# Patient Record
Sex: Male | Born: 1960 | Race: White | Hispanic: No | Marital: Married | Smoking: Former smoker
Health system: Southern US, Community
[De-identification: ages and names within clinical notes are randomized; demographics above are authoritative.]

## PROBLEM LIST (undated history)

## (undated) DIAGNOSIS — I10 Essential (primary) hypertension: Secondary | ICD-10-CM

---

## 1999-12-18 ENCOUNTER — Emergency Department (HOSPITAL_COMMUNITY): Admission: EM | Admit: 1999-12-18 | Discharge: 1999-12-19 | Payer: Self-pay | Admitting: Emergency Medicine

## 2013-04-06 ENCOUNTER — Emergency Department (HOSPITAL_COMMUNITY)
Admission: EM | Admit: 2013-04-06 | Discharge: 2013-04-06 | Disposition: A | Discharge status: Home / Self Care | Payer: BC Managed Care – PPO | Attending: Emergency Medicine | Admitting: Emergency Medicine

## 2013-04-06 ENCOUNTER — Emergency Department (HOSPITAL_COMMUNITY): Payer: BC Managed Care – PPO

## 2013-04-06 ENCOUNTER — Encounter (HOSPITAL_COMMUNITY): Payer: Self-pay | Admitting: Emergency Medicine

## 2013-04-06 DIAGNOSIS — R42 Dizziness and giddiness: Secondary | ICD-10-CM

## 2013-04-06 DIAGNOSIS — Z79899 Other long term (current) drug therapy: Secondary | ICD-10-CM | POA: Insufficient documentation

## 2013-04-06 DIAGNOSIS — J019 Acute sinusitis, unspecified: Secondary | ICD-10-CM | POA: Insufficient documentation

## 2013-04-06 DIAGNOSIS — J329 Chronic sinusitis, unspecified: Secondary | ICD-10-CM

## 2013-04-06 DIAGNOSIS — R5381 Other malaise: Secondary | ICD-10-CM | POA: Insufficient documentation

## 2013-04-06 DIAGNOSIS — Z87891 Personal history of nicotine dependence: Secondary | ICD-10-CM | POA: Insufficient documentation

## 2013-04-06 DIAGNOSIS — R209 Unspecified disturbances of skin sensation: Secondary | ICD-10-CM | POA: Insufficient documentation

## 2013-04-06 DIAGNOSIS — I10 Essential (primary) hypertension: Secondary | ICD-10-CM | POA: Insufficient documentation

## 2013-04-06 HISTORY — DX: Essential (primary) hypertension: I10

## 2013-04-06 LAB — URINALYSIS, ROUTINE W REFLEX MICROSCOPIC
Glucose, UA: NEGATIVE mg/dL
Leukocytes, UA: NEGATIVE
Specific Gravity, Urine: 1.01 (ref 1.005–1.030)
pH: 7.5 (ref 5.0–8.0)

## 2013-04-06 LAB — CBC WITH DIFFERENTIAL/PLATELET
Basophils Relative: 0 % (ref 0–1)
HCT: 44.8 % (ref 39.0–52.0)
Hemoglobin: 16.2 g/dL (ref 13.0–17.0)
MCHC: 36.2 g/dL — ABNORMAL HIGH (ref 30.0–36.0)
Monocytes Absolute: 0.5 10*3/uL (ref 0.1–1.0)
Monocytes Relative: 8 % (ref 3–12)
Neutro Abs: 4.3 10*3/uL (ref 1.7–7.7)

## 2013-04-06 LAB — BASIC METABOLIC PANEL
BUN: 12 mg/dL (ref 6–23)
CO2: 26 mEq/L (ref 19–32)
Chloride: 104 mEq/L (ref 96–112)
Creatinine, Ser: 0.98 mg/dL (ref 0.50–1.35)
GFR calc Af Amer: >90 mL/min (ref >90)

## 2013-04-06 MED ORDER — FLUTICASONE PROPIONATE 50 MCG/ACT NA SUSP: 2.0000 | Freq: Every day | NASAL | Status: DC

## 2013-04-06 MED ORDER — AZITHROMYCIN 250 MG PO TABS: ORAL_TABLET | ORAL | Status: DC

## 2013-04-06 MED ORDER — SODIUM CHLORIDE 0.9 % IV SOLN
INTRAVENOUS | Status: DC
  Administered 2013-04-06: 14:00:00 via INTRAVENOUS

## 2013-04-06 NOTE — ED Notes (Signed)
MD at bedside. 

## 2013-04-06 NOTE — ED Notes (Signed)
Pt reports recent changes to blood pressure medicine.  Pt reports s/s of weakness, dizziness, and tingling to bilateral hands/fingers.  Increased symptoms in left hand.

## 2013-04-06 NOTE — ED Provider Notes (Signed)
History     CSN: [id number]  Arrival date & time 04/06/13  1259   First MD Initiated Contact with Patient 04/06/13 1303      Chief Complaint  Patient presents with  . Dizziness  . Weakness  . Tingling    bilateral fingers- increased on left hand    (Consider location/radiation/quality/duration/timing/severity/associated sxs/prior treatment) HPI Comments: Patient presents to the ER for evaluation of weakness with dizziness. Patient reports he has not been feeling well for about a week. Patient has had no energy and has been sleeping more than usual. He has had sinus congestion and "cold symptoms". He has been taking DayQuil. This hasn't helped much. He has not had any cough. Patient denies chest pain, heart palpitations and shortness of breath. He has noticed intermittent episodes of tingling in his fingertips, both sides. Symptoms occur, he feels very weak, dizzy and like he is going to pass out. Has not had any syncope. He says when this occurs he feels very anxious his respiratory rate is increased.  Patient is a 52 y.o. male presenting with weakness.  Weakness Pertinent negatives include no chest pain.    Past Medical History  Diagnosis Date  . Hypertension     History reviewed. No pertinent past surgical history.  History reviewed. No pertinent family history.  History  Substance Use Topics  . Smoking status: Former Games developer  . Smokeless tobacco: Not on file  . Alcohol Use: No      Review of Systems  Cardiovascular: Negative for chest pain and palpitations.  Neurological: Positive for dizziness, weakness and numbness.  Psychiatric/Behavioral: The patient is nervous/anxious.   All other systems reviewed and are negative.    Allergies  Review of patient's allergies indicates no known allergies.  Home Medications   Current Outpatient Rx  Name  Route  Sig  Dispense  Refill  . atenolol (TENORMIN) 50 MG tablet   Oral   Take 100 mg by mouth daily.             BP 134/78  Pulse 84  Temp(Src) 98.3 F (36.8 C) (Oral)  Resp 18  SpO2 96%  Physical Exam  Constitutional: He is oriented to person, place, and time. He appears well-developed and well-nourished. No distress.  HENT:  Head: Normocephalic and atraumatic.  Right Ear: Hearing normal.  Nose: Nose normal.  Mouth/Throat: Oropharynx is clear and moist and mucous membranes are normal.  Eyes: Conjunctivae and EOM are normal. Pupils are equal, round, and reactive to light.  Neck: Normal range of motion. Neck supple.  Cardiovascular: Normal rate, regular rhythm, S1 normal and S2 normal.  Exam reveals no gallop and no friction rub.   No murmur heard. Pulmonary/Chest: Effort normal and breath sounds normal. No respiratory distress. He exhibits no tenderness.  Abdominal: Soft. Normal appearance and bowel sounds are normal. There is no hepatosplenomegaly. There is no tenderness. There is no rebound, no guarding, no tenderness at McBurney's point and negative Murphy's sign. No hernia.  Musculoskeletal: Normal range of motion.  Neurological: He is alert and oriented to person, place, and time. He has normal strength. No cranial nerve deficit or sensory deficit. Coordination normal. GCS eye subscore is 4. GCS verbal subscore is 5. GCS motor subscore is 6.  Skin: Skin is warm, dry and intact. No rash noted. No cyanosis.  Psychiatric: He has a normal mood and affect. His speech is normal and behavior is normal. Thought content normal.    ED Course  Procedures (including critical  care time)  EKG:  Date: 04/06/2013  Rate: 78  Rhythm: normal sinus rhythm  QRS Axis: normal  Intervals: normal  ST/T Wave abnormalities: nonspecific T wave changes  Conduction Disutrbances:none  Narrative Interpretation:   Old EKG Reviewed: none available    Labs Reviewed  CBC WITH DIFFERENTIAL - Abnormal; Notable for the following:    MCHC 36.2 (*)    All other components within normal limits  BASIC METABOLIC  PANEL - Abnormal; Notable for the following:    Glucose, Bld 121 (*)    All other components within normal limits  PRO B NATRIURETIC PEPTIDE  TROPONIN I  URINALYSIS, ROUTINE W REFLEX MICROSCOPIC  D-DIMER, QUANTITATIVE   Ct Head Wo Contrast  04/06/2013  *RADIOLOGY REPORT*  Clinical Data: Weakness and dizziness.  Tingling in the hands and fingers.  CT HEAD WITHOUT CONTRAST  Technique:  Contiguous axial images were obtained from the base of the skull through the vertex without contrast.  Comparison: None.  Findings: The brain appears normal without infarct, hemorrhage, mass lesion, mass effect, midline shift or abnormal extra-axial fluid collection.  No hydrocephalus or pneumocephalus.  Mild mucosal thickening maxillary sinus is noted.  IMPRESSION: No acute finding.  Mild maxillary sinus disease.   Original Report Authenticated By: Holley Dexter, M.D.      Diagnosis: 1. Weakness 2. Dizziness 3. Sinusitis    MDM  Patient comes to the ER for evaluation of generalized weakness with intermittent episodes of dizziness, tingling of the fingers. He admits that when the fingers tingle, he is having some anxiety and breathing fast. This is likely hyperventilation syndrome. Symptoms of dizziness may be related to recent blood pressure medication changes. He is now taking atenolol twice a day. This was a recent change in the last week. I recommended was back to the 50 mg once a day until followup with his primary Dr.  Barbaraann Faster neurologic exam is entirely unremarkable. His vital signs have been stable here. He does report "cold symptoms". This is mainly in the sinus congestion and he does have evidence of sinusitis on CAT scan. This might be causing some of his symptoms. He has been using sinus medication, likely containing a stimulant. We'll discontinue this. His Flonase and Zithromax. Followup with primary Dr. in the office.        Gilda Crease, MD 04/06/13 [phone]

## 2014-03-04 ENCOUNTER — Emergency Department (HOSPITAL_COMMUNITY)
Admission: EM | Admit: 2014-03-04 | Discharge: 2014-03-04 | Disposition: A | Discharge status: Home / Self Care | Payer: BC Managed Care – PPO | Attending: Emergency Medicine | Admitting: Emergency Medicine

## 2014-03-04 ENCOUNTER — Emergency Department (HOSPITAL_COMMUNITY): Payer: BC Managed Care – PPO

## 2014-03-04 ENCOUNTER — Encounter (HOSPITAL_COMMUNITY): Payer: Self-pay | Admitting: Emergency Medicine

## 2014-03-04 DIAGNOSIS — R079 Chest pain, unspecified: Secondary | ICD-10-CM | POA: Insufficient documentation

## 2014-03-04 DIAGNOSIS — F430 Acute stress reaction: Secondary | ICD-10-CM | POA: Insufficient documentation

## 2014-03-04 DIAGNOSIS — IMO0002 Reserved for concepts with insufficient information to code with codable children: Secondary | ICD-10-CM | POA: Insufficient documentation

## 2014-03-04 DIAGNOSIS — F411 Generalized anxiety disorder: Secondary | ICD-10-CM | POA: Insufficient documentation

## 2014-03-04 DIAGNOSIS — Z7982 Long term (current) use of aspirin: Secondary | ICD-10-CM | POA: Insufficient documentation

## 2014-03-04 DIAGNOSIS — Z79899 Other long term (current) drug therapy: Secondary | ICD-10-CM | POA: Insufficient documentation

## 2014-03-04 DIAGNOSIS — Z87891 Personal history of nicotine dependence: Secondary | ICD-10-CM | POA: Insufficient documentation

## 2014-03-04 DIAGNOSIS — I10 Essential (primary) hypertension: Secondary | ICD-10-CM | POA: Insufficient documentation

## 2014-03-04 DIAGNOSIS — Z792 Long term (current) use of antibiotics: Secondary | ICD-10-CM | POA: Insufficient documentation

## 2014-03-04 LAB — I-STAT TROPONIN, ED: TROPONIN I, POC: 0 ng/mL (ref 0.00–0.08)

## 2014-03-04 LAB — BASIC METABOLIC PANEL
BUN: 12 mg/dL (ref 6–23)
CALCIUM: 9.3 mg/dL (ref 8.4–10.5)
CO2: 26 meq/L (ref 19–32)
CREATININE: 1.01 mg/dL (ref 0.50–1.35)
Chloride: 102 mEq/L (ref 96–112)
GFR, EST NON AFRICAN AMERICAN: 82 mL/min — AB (ref >90)
Glucose, Bld: 161 mg/dL — ABNORMAL HIGH (ref 70–99)
Potassium: 4.2 mEq/L (ref 3.7–5.3)
SODIUM: 143 meq/L (ref 137–147)

## 2014-03-04 LAB — CBC
HCT: 46.7 % (ref 39.0–52.0)
Hemoglobin: 16.2 g/dL (ref 13.0–17.0)
MCH: 29.8 pg (ref 26.0–34.0)
MCHC: 34.7 g/dL (ref 30.0–36.0)
MCV: 86 fL (ref 78.0–100.0)
PLATELETS: 195 10*3/uL (ref 150–400)
RBC: 5.43 MIL/uL (ref 4.22–5.81)
RDW: 12.3 % (ref 11.5–15.5)
WBC: 4.2 10*3/uL (ref 4.0–10.5)

## 2014-03-04 MED ORDER — LORAZEPAM 1 MG PO TABS: 1.0000 mg | ORAL_TABLET | Freq: Three times a day (TID) | ORAL | Status: AC | PRN

## 2014-03-04 NOTE — Discharge Instructions (Signed)
Chest Pain (Nonspecific) °It is often hard to give a specific diagnosis for the cause of chest pain. There is always a chance that your pain could be related to something serious, such as a heart attack or a blood clot in the lungs. You need to follow up with your caregiver for further evaluation. °CAUSES  °· Heartburn. °· Pneumonia or bronchitis. °· Anxiety or stress. °· Inflammation around your heart (pericarditis) or lung (pleuritis or pleurisy). °· A blood clot in the lung. °· A collapsed lung (pneumothorax). It can develop suddenly on its own (spontaneous pneumothorax) or from injury (trauma) to the chest. °· Shingles infection (herpes zoster virus). °The chest wall is composed of bones, muscles, and cartilage. Any of these can be the source of the pain. °· The bones can be bruised by injury. °· The muscles or cartilage can be strained by coughing or overwork. °· The cartilage can be affected by inflammation and become sore (costochondritis). °DIAGNOSIS  °Lab tests or other studies, such as X-rays, electrocardiography, stress testing, or cardiac imaging, may be needed to find the cause of your pain.  °TREATMENT  °· Treatment depends on what may be causing your chest pain. Treatment may include: °· Acid blockers for heartburn. °· Anti-inflammatory medicine. °· Pain medicine for inflammatory conditions. °· Antibiotics if an infection is present. °· You may be advised to change lifestyle habits. This includes stopping smoking and avoiding alcohol, caffeine, and chocolate. °· You may be advised to keep your head raised (elevated) when sleeping. This reduces the chance of acid going backward from your stomach into your esophagus. °· Most of the time, nonspecific chest pain will improve within 2 to 3 days with rest and mild pain medicine. °HOME CARE INSTRUCTIONS  °· If antibiotics were prescribed, take your antibiotics as directed. Finish them even if you start to feel better. °· For the next few days, avoid physical  activities that bring on chest pain. Continue physical activities as directed. °· Do not smoke. °· Avoid drinking alcohol. °· Only take over-the-counter or prescription medicine for pain, discomfort, or fever as directed by your caregiver. °· Follow your caregiver's suggestions for further testing if your chest pain does not go away. °· Keep any follow-up appointments you made. If you do not go to an appointment, you could develop lasting (chronic) problems with pain. If there is any problem keeping an appointment, you must call to reschedule. °SEEK MEDICAL CARE IF:  °· You think you are having problems from the medicine you are taking. Read your medicine instructions carefully. °· Your chest pain does not go away, even after treatment. °· You develop a rash with blisters on your chest. °SEEK IMMEDIATE MEDICAL CARE IF:  °· You have increased chest pain or pain that spreads to your arm, neck, jaw, back, or abdomen. °· You develop shortness of breath, an increasing cough, or you are coughing up blood. °· You have severe back or abdominal pain, feel nauseous, or vomit. °· You develop severe weakness, fainting, or chills. °· You have a fever. °THIS IS AN EMERGENCY. Do not wait to see if the pain will go away. Get medical help at once. Call your local emergency services (911 in [place] Do not drive yourself to the hospital. °MAKE SURE YOU:  °· Understand these instructions. °· Will watch your condition. °· Will get help right away if you are not doing well or get worse. °Document Released: 09/20/2005 Document Revised: 03/04/2012 Document Reviewed: 07/16/2008 °Exit[employer]® Patient Information ©2014 [employer]   [employer]. ° °

## 2014-03-04 NOTE — ED Notes (Signed)
Social worker at bedside.

## 2014-03-04 NOTE — ED Provider Notes (Signed)
Medical screening examination/treatment/procedure(s) were performed by non-physician practitioner and as supervising physician I was immediately available for consultation/collaboration.  Flint MelterElliott L Islay Polanco, MD 03/04/14 [phone]

## 2014-03-04 NOTE — ED Provider Notes (Signed)
CSN: [id number]     Arrival date & time 03/04/14  1045 History   First MD Initiated Contact with Patient 03/04/14 1120     Chief Complaint  Patient presents with  . Chest Pain     (Consider location/radiation/quality/duration/timing/severity/associated sxs/prior Treatment) HPI Comments: Patient presents emergency department with chief complaints of anxiety and chest pain. He states that the chest pain been intermittent for the past couple of days. He denies any associated shortness of breath, or diaphoresis. Denies any radiating symptoms. He states that he is not having any pain now. He states that he feels anxious. He states that he has a new job, and it comes with a lot of stress. He denies any SI or HI. He states that he is been taking Xanax with some relief. As prescribed by his PCP. He states that he's been feeling weak tired and fatigued for the past couple of months. He reports decreased appetite, and a desire to sleep all the time. No other health problems.  The history is provided by the patient. No language interpreter was used.    Past Medical History  Diagnosis Date  . Hypertension    History reviewed. No pertinent past surgical history. History reviewed. No pertinent family history. History  Substance Use Topics  . Smoking status: Former Games developermoker  . Smokeless tobacco: Not on file  . Alcohol Use: No    Review of Systems  All other systems reviewed and are negative.      Allergies  Codeine  Home Medications   Current Outpatient Rx  Name  Route  Sig  Dispense  Refill  . aspirin EC 81 MG tablet   Oral   Take 81 mg by mouth daily.         Marland Kitchen. atenolol (TENORMIN) 50 MG tablet   Oral   Take 50 mg by mouth 2 (two) times daily.          Marland Kitchen. azithromycin (ZITHROMAX Z-PAK) 250 MG tablet      2 po day one, then 1 daily x 4 days   6 tablet   0   . fish oil-omega-3 fatty acids 1000 MG capsule   Oral   Take 2 g by mouth daily.         . fluticasone (FLONASE)  50 MCG/ACT nasal spray   Nasal   Place 2 sprays into the nose daily.   16 g   0    BP 134/93  Pulse 84  Temp(Src) 98.3 F (36.8 C) (Oral)  Resp 20  SpO2 98% Physical Exam  Nursing note and vitals reviewed. Constitutional: He is oriented to person, place, and time. He appears well-developed and well-nourished.  HENT:  Head: Normocephalic and atraumatic.  Eyes: Conjunctivae and EOM are normal. Pupils are equal, round, and reactive to light. Right eye exhibits no discharge. Left eye exhibits no discharge. No scleral icterus.  Neck: Normal range of motion. Neck supple. No JVD present.  Cardiovascular: Normal rate, regular rhythm and normal heart sounds.  Exam reveals no gallop and no friction rub.   No murmur heard. Pulmonary/Chest: Effort normal and breath sounds normal. No respiratory distress. He has no wheezes. He has no rales. He exhibits no tenderness.  Abdominal: Soft. He exhibits no distension and no mass. There is no tenderness. There is no rebound and no guarding.  Musculoskeletal: Normal range of motion. He exhibits no edema and no tenderness.  Neurological: He is alert and oriented to person, place, and time.  Skin:  Skin is warm and dry.  Psychiatric: He has a normal mood and affect. His behavior is normal. Judgment and thought content normal.    ED Course  Procedures (including critical care time) Results for orders placed during the hospital encounter of 03/04/14  CBC      Result Value Ref Range   WBC 4.2  4.0 - 10.5 K/uL   RBC 5.43  4.22 - 5.81 MIL/uL   Hemoglobin 16.2  13.0 - 17.0 g/dL   HCT 69.6  29.5 - 28.4 %   MCV 86.0  78.0 - 100.0 fL   MCH 29.8  26.0 - 34.0 pg   MCHC 34.7  30.0 - 36.0 g/dL   RDW 13.2  44.0 - 10.2 %   Platelets 195  150 - 400 K/uL  BASIC METABOLIC PANEL      Result Value Ref Range   Sodium 143  137 - 147 mEq/L   Potassium 4.2  3.7 - 5.3 mEq/L   Chloride 102  96 - 112 mEq/L   CO2 26  19 - 32 mEq/L   Glucose, Bld 161 (*) 70 - 99 mg/dL    BUN 12  6 - 23 mg/dL   Creatinine, Ser 7.25  0.50 - 1.35 mg/dL   Calcium 9.3  8.4 - 36.6 mg/dL   GFR calc non Af Amer 82 (*) >90 mL/min   GFR calc Af Amer >90  >90 mL/min  I-STAT TROPOININ, ED      Result Value Ref Range   Troponin i, poc 0.00  0.00 - 0.08 ng/mL   Comment 3            Dg Chest 2 View  03/04/2014   CLINICAL DATA Chest pain  EXAM CHEST  2 VIEW  COMPARISON None.  FINDINGS Lungs are clear. Heart size and pulmonary vascularity are normal. No adenopathy. No pneumothorax. No bone lesions.  IMPRESSION No abnormality noted.  SIGNATURE  Electronically Signed   By: Bretta Bang M.D.   On: 03/04/2014 11:30    Imaging Review Dg Chest 2 View  03/04/2014   CLINICAL DATA Chest pain  EXAM CHEST  2 VIEW  COMPARISON None.  FINDINGS Lungs are clear. Heart size and pulmonary vascularity are normal. No adenopathy. No pneumothorax. No bone lesions.  IMPRESSION No abnormality noted.  SIGNATURE  Electronically Signed   By: Bretta Bang M.D.   On: 03/04/2014 11:30     EKG Interpretation None     ED ECG REPORT  I personally interpreted this EKG   Date: 03/04/2014   Rate: 80  Rhythm: normal sinus rhythm  QRS Axis: normal  Intervals: normal  ST/T Wave abnormalities: nonspecific T wave changes  Conduction Disutrbances:none  Narrative Interpretation:   Old EKG Reviewed: none available    MDM   Final diagnoses:  Chest pain    Patient with anxiety x6 weeks. Complaining of intermittent chest pain which is improved with Xanax. There is no exertional component. He has not felt short of breath, nor is he diaphoretic. He does not have any chest pain in the emergency department. Doubt any ACS, doubt PE. The patient is comfortable. He is not in apparent distress. Suspect that this is likely anxiety driven, as it resolves with Xanax, and is not anginal in nature. I discussed patient with Dr. Effie Shy, who agrees we can discharge the patient with community resources. Patient understands and  agrees to plan. He is stable for discharge.   Roxy Horseman, PA-C 03/04/14 1334

## 2014-03-04 NOTE — ED Notes (Signed)
Pt reports he hasn't been feeling well for 6 weeks, has been seen at PCP last week. Pt started a new job and its been stressful, PCP started on low dose of anxiety meds. Pt started waking up feeling weak the past week. sts it does help with anxiety but hasn't made him feel any better. Pt noticed some intermittent sharp left upper CP, sometimes feel lightheaded. sts he has been having this pain of and off for 30 years but the last week he has noticed they are more frequent. Pt c/o increased generalized weakness. sts he feels like he wants to sleep all the time, decreased appetite. Denies hx of heart problems. Nad, skin warm and dry, resp e/u.

## 2015-07-11 IMAGING — CR DG CHEST 2V
2 series · 2 of 2 positions shown · non-contrast
Comparison: none

[w chest pa]
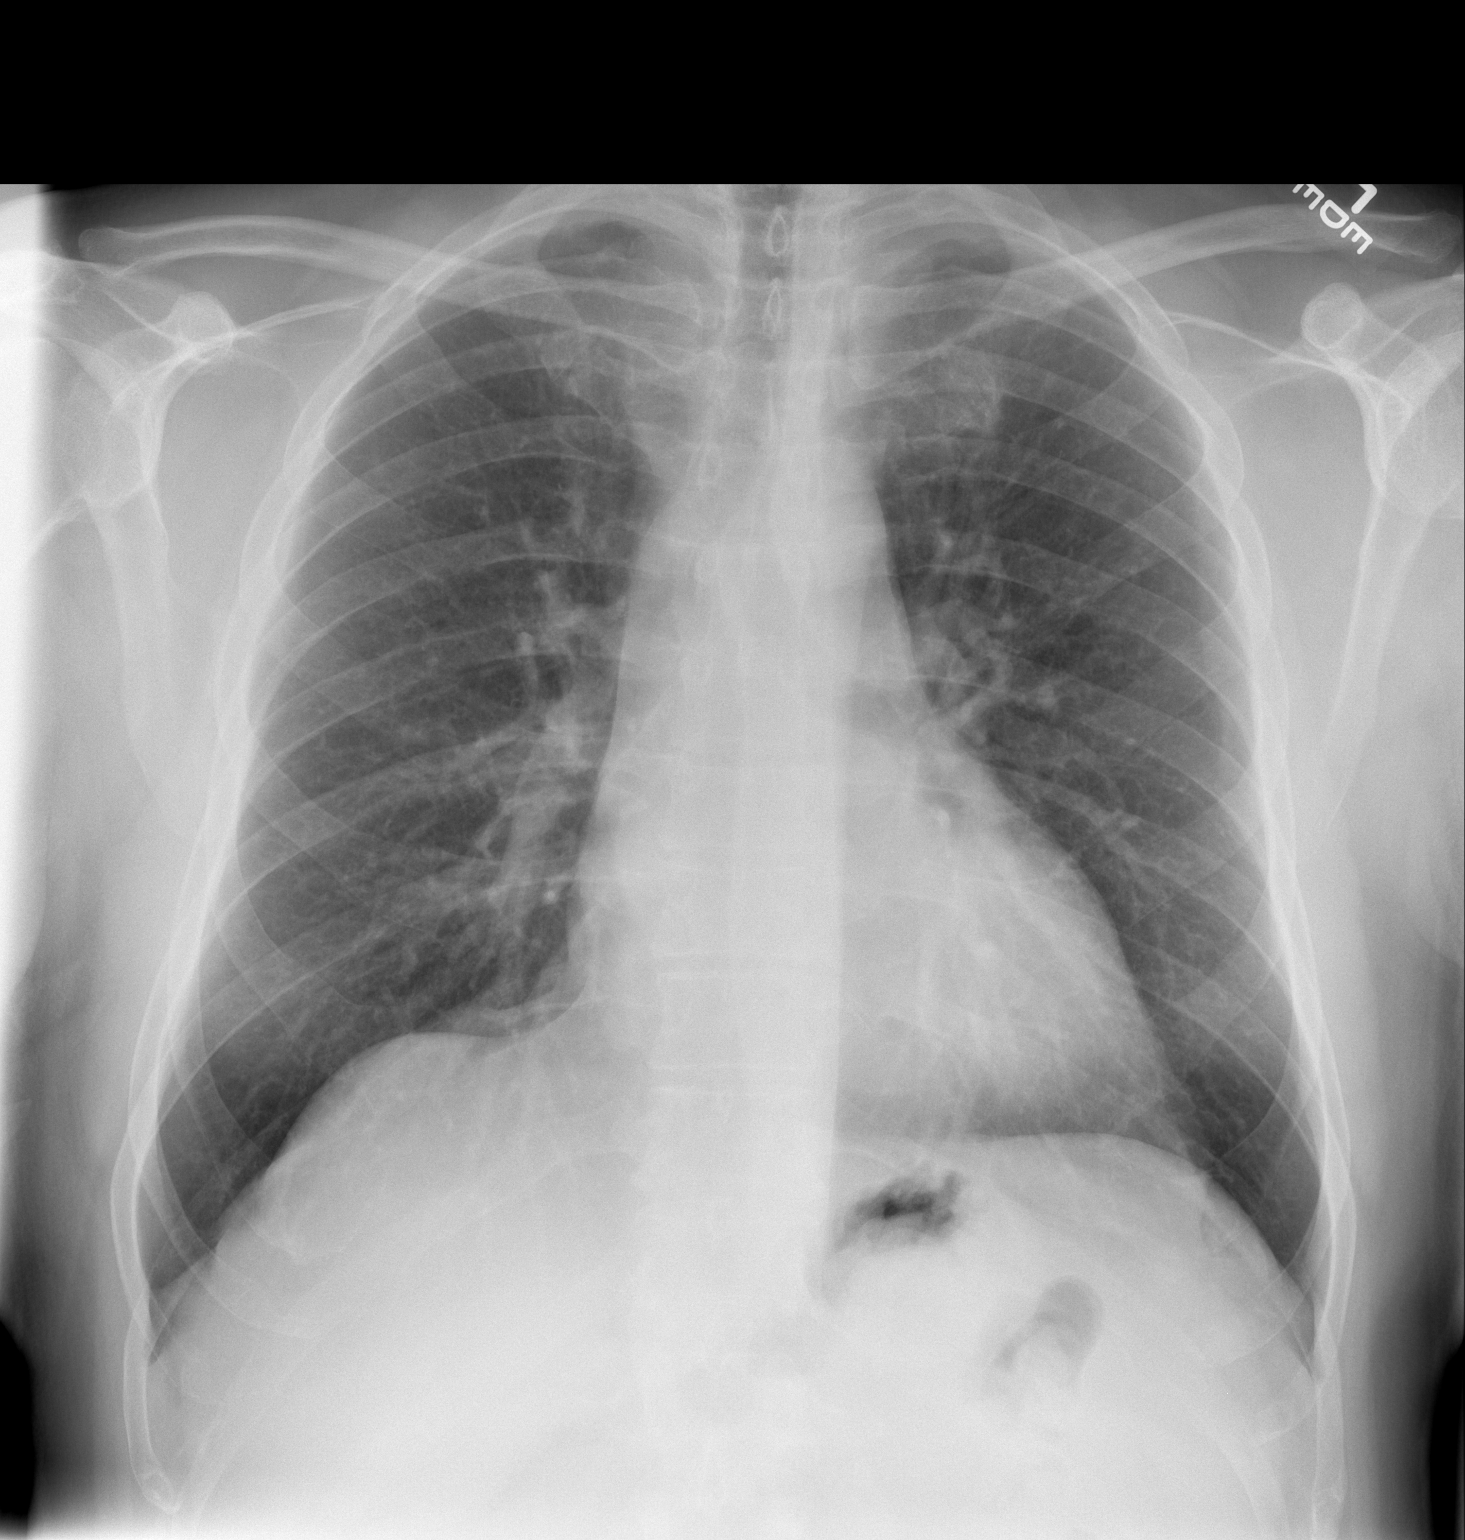

[w chest lat]
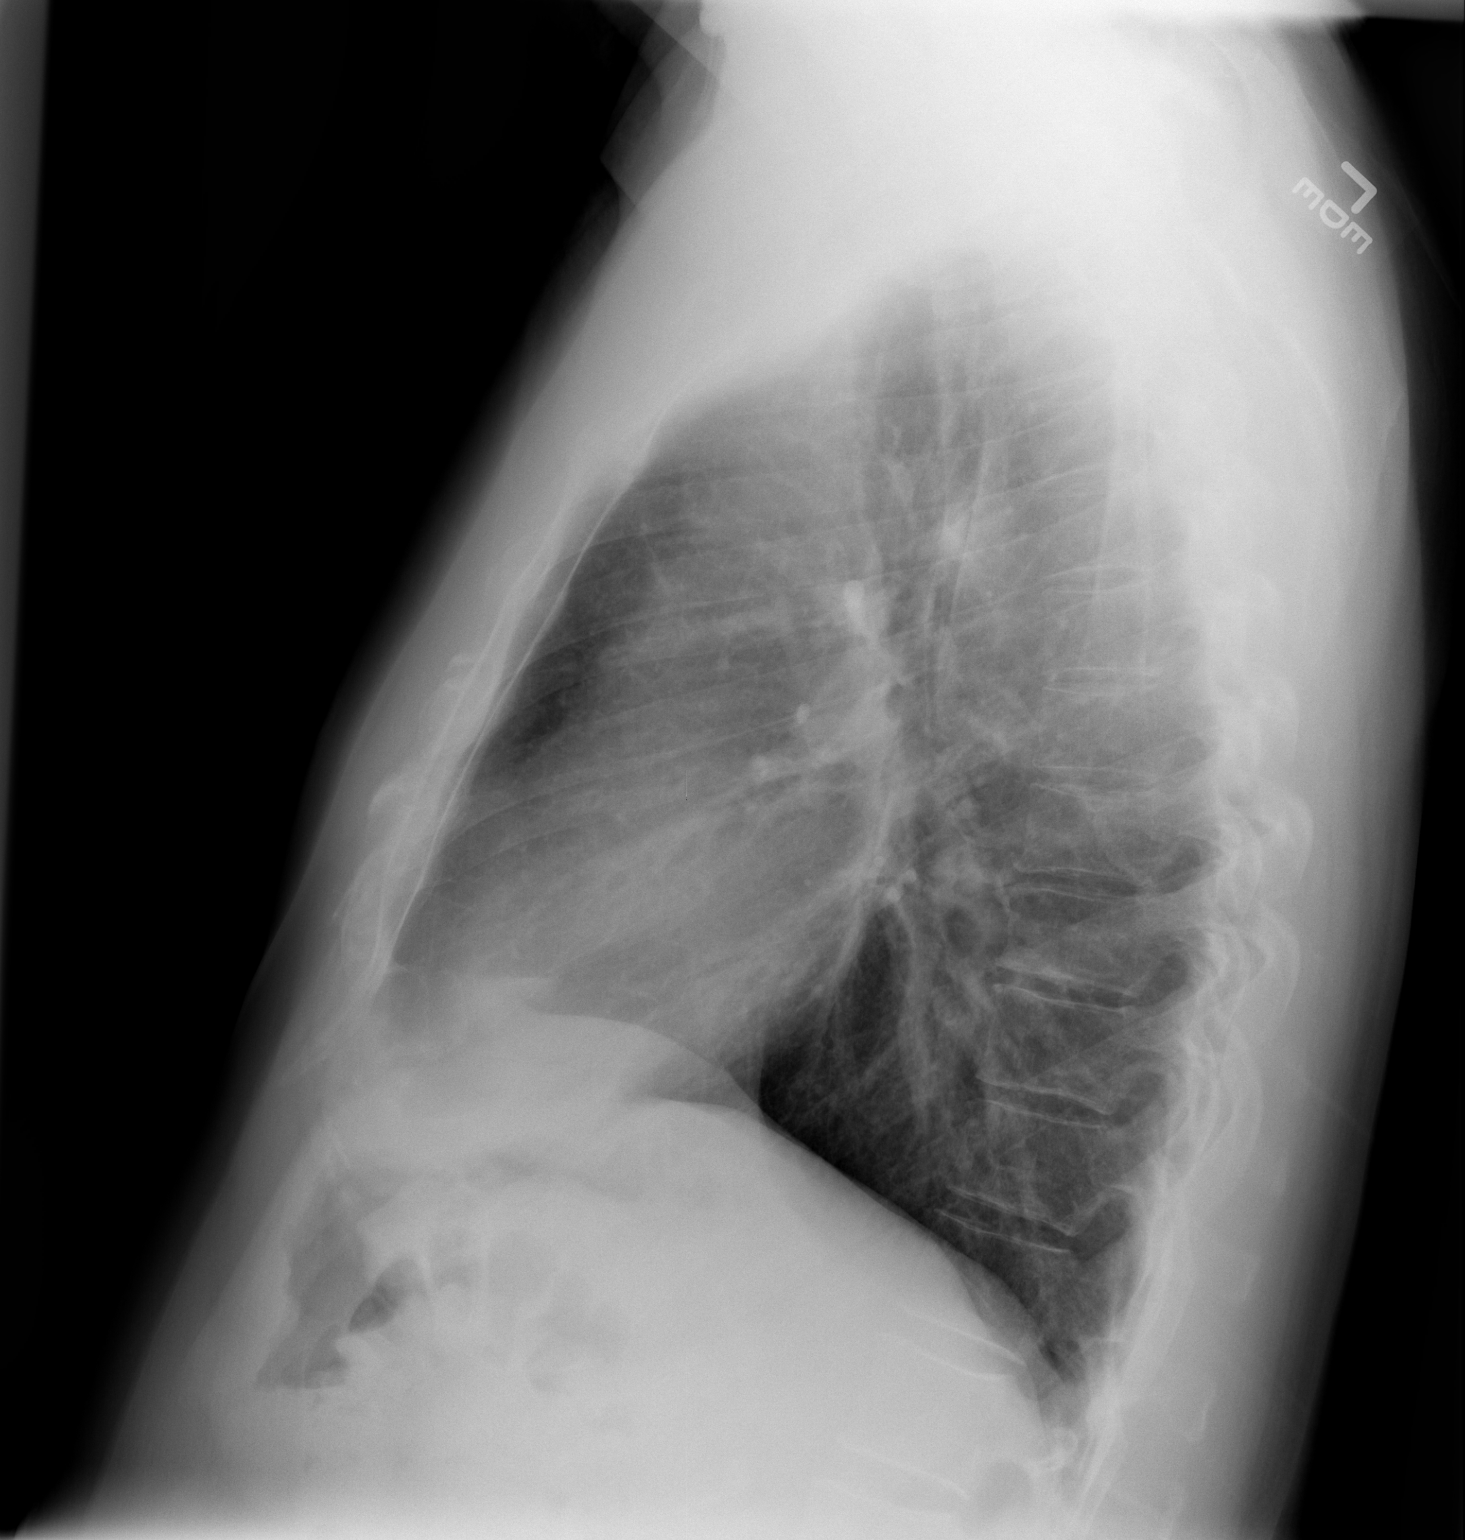

[2 of 2 positions shown; findings below may reference images not displayed]

CLINICAL DATA
Chest pain

EXAM
CHEST  2 VIEW

COMPARISON
None.

FINDINGS
Lungs are clear. Heart size and pulmonary vascularity are normal. No
adenopathy. No pneumothorax. No bone lesions.

IMPRESSION
No abnormality noted.

SIGNATURE

## 2024-10-31 DIAGNOSIS — H6123 Impacted cerumen, bilateral: Secondary | ICD-10-CM | POA: Diagnosis not present

## 2025-01-07 ENCOUNTER — Emergency Department (HOSPITAL_BASED_OUTPATIENT_CLINIC_OR_DEPARTMENT_OTHER)
Admission: EM | Admit: 2025-01-07 | Discharge: 2025-01-07 | Disposition: A | Discharge status: Home / Self Care | Attending: Emergency Medicine | Admitting: Emergency Medicine

## 2025-01-07 ENCOUNTER — Other Ambulatory Visit: Payer: Self-pay

## 2025-01-07 ENCOUNTER — Encounter (HOSPITAL_BASED_OUTPATIENT_CLINIC_OR_DEPARTMENT_OTHER): Payer: Self-pay | Admitting: Emergency Medicine

## 2025-01-07 DIAGNOSIS — Z7982 Long term (current) use of aspirin: Secondary | ICD-10-CM | POA: Diagnosis not present

## 2025-01-07 DIAGNOSIS — E876 Hypokalemia: Secondary | ICD-10-CM | POA: Diagnosis not present

## 2025-01-07 DIAGNOSIS — R509 Fever, unspecified: Secondary | ICD-10-CM | POA: Diagnosis present

## 2025-01-07 DIAGNOSIS — D72829 Elevated white blood cell count, unspecified: Secondary | ICD-10-CM | POA: Insufficient documentation

## 2025-01-07 DIAGNOSIS — R55 Syncope and collapse: Secondary | ICD-10-CM | POA: Diagnosis not present

## 2025-01-07 DIAGNOSIS — J101 Influenza due to other identified influenza virus with other respiratory manifestations: Secondary | ICD-10-CM | POA: Diagnosis not present

## 2025-01-07 DIAGNOSIS — Z79899 Other long term (current) drug therapy: Secondary | ICD-10-CM | POA: Diagnosis not present

## 2025-01-07 DIAGNOSIS — E860 Dehydration: Secondary | ICD-10-CM | POA: Diagnosis not present

## 2025-01-07 LAB — RESP PANEL BY RT-PCR (RSV, FLU A&B, COVID)  RVPGX2
Influenza A by PCR: POSITIVE — AB
Influenza B by PCR: NEGATIVE
Resp Syncytial Virus by PCR: NEGATIVE
SARS Coronavirus 2 by RT PCR: NEGATIVE

## 2025-01-07 LAB — URINALYSIS, ROUTINE W REFLEX MICROSCOPIC
Bacteria, UA: NONE SEEN
Bilirubin Urine: NEGATIVE
Glucose, UA: NEGATIVE mg/dL
Ketones, ur: 40 mg/dL — AB
Leukocytes,Ua: NEGATIVE
Nitrite: NEGATIVE
Protein, ur: 30 mg/dL — AB
Specific Gravity, Urine: 1.023 (ref 1.005–1.030)
pH: 6 (ref 5.0–8.0)

## 2025-01-07 LAB — CBC WITH DIFFERENTIAL/PLATELET
Abs Immature Granulocytes: 0.01 K/uL (ref 0.00–0.07)
Basophils Absolute: 0 K/uL (ref 0.0–0.1)
Basophils Relative: 0 %
Eosinophils Absolute: 0 K/uL (ref 0.0–0.5)
Eosinophils Relative: 0 %
HCT: 40.4 % (ref 39.0–52.0)
Hemoglobin: 14 g/dL (ref 13.0–17.0)
Immature Granulocytes: 0 %
Lymphocytes Relative: 18 %
Lymphs Abs: 0.7 K/uL (ref 0.7–4.0)
MCH: 28.7 pg (ref 26.0–34.0)
MCHC: 34.7 g/dL (ref 30.0–36.0)
MCV: 82.8 fL (ref 80.0–100.0)
Monocytes Absolute: 0.4 K/uL (ref 0.1–1.0)
Monocytes Relative: 11 %
Neutro Abs: 2.7 K/uL (ref 1.7–7.7)
Neutrophils Relative %: 71 %
Platelets: 149 K/uL — ABNORMAL LOW (ref 150–400)
RBC: 4.88 MIL/uL (ref 4.22–5.81)
RDW: 12.9 % (ref 11.5–15.5)
WBC: 3.8 K/uL — ABNORMAL LOW (ref 4.0–10.5)
nRBC: 0 % (ref 0.0–0.2)

## 2025-01-07 LAB — COMPREHENSIVE METABOLIC PANEL WITH GFR
ALT: 24 U/L (ref 0–44)
AST: 41 U/L (ref 15–41)
Albumin: 3.4 g/dL — ABNORMAL LOW (ref 3.5–5.0)
Alkaline Phosphatase: 69 U/L (ref 38–126)
Anion gap: 15 (ref 5–15)
BUN: 15 mg/dL (ref 8–23)
CO2: 22 mmol/L (ref 22–32)
Calcium: 8.2 mg/dL — ABNORMAL LOW (ref 8.9–10.3)
Chloride: 98 mmol/L (ref 98–111)
Creatinine, Ser: 0.91 mg/dL (ref 0.61–1.24)
GFR, Estimated: >60 mL/min
Glucose, Bld: 159 mg/dL — ABNORMAL HIGH (ref 70–99)
Potassium: 3 mmol/L — ABNORMAL LOW (ref 3.5–5.1)
Sodium: 135 mmol/L (ref 135–145)
Total Bilirubin: 0.4 mg/dL (ref 0.0–1.2)
Total Protein: 6.2 g/dL — ABNORMAL LOW (ref 6.5–8.1)

## 2025-01-07 LAB — ETHANOL: Alcohol, Ethyl (B): <15 mg/dL

## 2025-01-07 LAB — LIPASE, BLOOD: Lipase: 42 U/L (ref 11–51)

## 2025-01-07 LAB — LACTIC ACID, PLASMA: Lactic Acid, Venous: 1.5 mmol/L (ref 0.5–1.9)

## 2025-01-07 LAB — TROPONIN T, HIGH SENSITIVITY: Troponin T High Sensitivity: <15 ng/L (ref 0–19)

## 2025-01-07 MED ORDER — LACTATED RINGERS IV BOLUS
1000.0000 mL | Freq: Once | INTRAVENOUS | Status: AC
  Administered 2025-01-07: 1000 mL via INTRAVENOUS

## 2025-01-07 MED ORDER — POTASSIUM CHLORIDE CRYS ER 20 MEQ PO TBCR
40.0000 meq | EXTENDED_RELEASE_TABLET | Freq: Once | ORAL | Status: AC
  Administered 2025-01-07: 40 meq via ORAL
  Filled 2025-01-07: qty 2

## 2025-01-07 MED ORDER — POTASSIUM CHLORIDE CRYS ER 20 MEQ PO TBCR: 20.0000 meq | EXTENDED_RELEASE_TABLET | Freq: Two times a day (BID) | ORAL | 0 refills | Status: AC

## 2025-01-07 MED ORDER — ONDANSETRON HCL 4 MG/2ML IJ SOLN
4.0000 mg | Freq: Once | INTRAMUSCULAR | Status: AC
  Administered 2025-01-07: 4 mg via INTRAVENOUS
  Filled 2025-01-07: qty 2

## 2025-01-07 MED ORDER — ONDANSETRON 4 MG PO TBDP: 4.0000 mg | ORAL_TABLET | ORAL | 0 refills | Status: AC | PRN

## 2025-01-07 NOTE — ED Triage Notes (Addendum)
 Pt arrived via [facility] from home c/o weakness, fever, vomiting x4 days, syncope this morn when getting out of bed. 4mg  zofran  IV admin via EMS with 400 mL NS bolus. Pt denies hitting his head and denies injury when syncope occurred.   EMS VS  BP 134/88 HR 90 CBG 138

## 2025-01-07 NOTE — ED Provider Notes (Signed)
 " [place] [facility] AT [facility] Provider Note   CSN: [id number] Arrival date & time: 01/07/25  9077     Patient presents with: Loss of Consciousness   Angel Sherman is a 64 y.o. male.   HPI Patient reports has been sick for about 4 or 5 days.  He has had a lot of general fatigue, body aches and fever.  Patient reports she has had a generalized headache.  Patient denies that he has been vomiting but this morning when he got up he felt extremely nauseated and felt that he was going to vomit.  He was heading into the bathroom and then got lightheaded and passed out.  Patient reports that he was not aware that he was all the way out but his wife reports that it was a couple minutes before she could rouse him again and this is why she called EMS.  Patient reports that soon as he was awake he was aware of what it happened.  He denies he had any chest pain or shortness of breath preceding this episode or after.  Patient reports that his wife is also been sick for these past 4 days.  He reports that she has had extensive vomiting and diarrhea although he has not had that symptom.  He reports he has been trying to stay hydrated but really has not been eating much.  Neither of them have gone to the doctor to get evaluated to see what the symptoms are from.    Prior to Admission medications  Medication Sig Start Date End Date Taking? Authorizing Provider  ondansetron  (ZOFRAN -ODT) 4 MG disintegrating tablet Take 1 tablet (4 mg total) by mouth every 4 (four) hours as needed for nausea or vomiting. 01/07/25  Yes Armenta Canning, MD  potassium chloride  SA (KLOR-CON  M) 20 MEQ tablet Take 1 tablet (20 mEq total) by mouth 2 (two) times daily. 01/07/25  Yes Tylesha Gibeault, Canning, MD  ALPRAZolam (XANAX) 0.25 MG tablet Take 0.5 mg by mouth 3 (three) times daily as needed for anxiety.    [provider]  aspirin EC 81 MG tablet Take 81 mg by mouth daily.    [provider]   atenolol (TENORMIN) 50 MG tablet Take 50 mg by mouth daily.     [provider]  fish oil-omega-3 fatty acids 1000 MG capsule Take 2 g by mouth daily.    [provider]  Garlic 1000 MG CAPS Take 1 capsule by mouth daily.    [provider]  LORazepam  (ATIVAN ) 1 MG tablet Take 1 tablet (1 mg total) by mouth every 8 (eight) hours as needed for anxiety. 03/04/14   Vicky Charleston, PA-C  Saw Palmetto, Serenoa repens, 450 MG CAPS Take 900 mg by mouth daily.    [provider]    Allergies: Codeine    Review of Systems  Updated Vital Signs BP (!) 142/85   Pulse 87   Temp 99.4 F (37.4 C) (Oral)   Resp 13   Ht 6' (1.829 m)   Wt 95.3 kg   SpO2 96%   BMI 28.48 kg/m   Physical Exam Constitutional:      Comments: Patient is alert.  Clear mental status.  Mild ill uncomfortable in appearance.  Nontoxic.  No respiratory distress.  Well-nourished well-developed.  HENT:     Head: Normocephalic and atraumatic.     Mouth/Throat:     Mouth: Mucous membranes are dry.     Pharynx: Oropharynx is clear.  Cardiovascular:     Rate and Rhythm: Normal rate and regular rhythm.  Pulmonary:     Effort: Pulmonary effort is normal.     Breath sounds: Normal breath sounds.  Abdominal:     General: There is no distension.     Palpations: Abdomen is soft.     Tenderness: There is no abdominal tenderness. There is no guarding.  Musculoskeletal:        General: No swelling or tenderness. Normal range of motion.     Cervical back: Neck supple.     Right lower leg: No edema.     Left lower leg: No edema.  Skin:    General: Skin is warm and dry.  Neurological:     General: No focal deficit present.     Mental Status: He is oriented to person, place, and time.     Motor: No weakness.     Coordination: Coordination normal.  Psychiatric:        Mood and Affect: Mood normal.     (all labs ordered are listed, but only abnormal results are displayed) Labs Reviewed   RESP PANEL BY RT-PCR (RSV, FLU A&B, COVID)  RVPGX2 - Abnormal; Notable for the following components:      Result Value   Influenza A by PCR POSITIVE (*)    All other components within normal limits  COMPREHENSIVE METABOLIC PANEL WITH GFR - Abnormal; Notable for the following components:   Potassium 3.0 (*)    Glucose, Bld 159 (*)    Calcium 8.2 (*)    Total Protein 6.2 (*)    Albumin 3.4 (*)    All other components within normal limits  CBC WITH DIFFERENTIAL/PLATELET - Abnormal; Notable for the following components:   WBC 3.8 (*)    Platelets 149 (*)    All other components within normal limits  URINALYSIS, ROUTINE W REFLEX MICROSCOPIC - Abnormal; Notable for the following components:   Hgb urine dipstick TRACE (*)    Ketones, ur 40 (*)    Protein, ur 30 (*)    All other components within normal limits  ETHANOL  LACTIC ACID, PLASMA  LIPASE, BLOOD  TROPONIN T, HIGH SENSITIVITY    EKG: EKG Interpretation Date/Time:  Wednesday January 07 2025 09:48:29 EST Ventricular Rate:  80 PR Interval:  135 QRS Duration:  107 QT Interval:  354 QTC Calculation: 409 R Axis:   62  Text Interpretation: Sinus rhythm Borderline repolarization abnormality no acute ischemic appearance. similar to older tracing Confirmed by Armenta Canning [phone]) on 01/07/2025 1:02:54 PM  Radiology: No results found.   Procedures   Medications Ordered in the ED  lactated ringers  bolus 1,000 mL (0 mLs Intravenous Stopped 01/07/25 1114)  lactated ringers  bolus 1,000 mL (0 mLs Intravenous Stopped 01/07/25 1243)  potassium chloride  SA (KLOR-CON  M) CR tablet 40 mEq (40 mEq Oral Given 01/07/25 1049)  ondansetron  (ZOFRAN ) injection 4 mg (4 mg Intravenous Given 01/07/25 1240)                                    Medical Decision Making Amount and/or Complexity of Data Reviewed Labs: ordered.  Risk Prescription drug management.   Patient presents as outlined.  He had syncopal episode after having been sick  for about 4 days.  Will proceed with diagnostic evaluation and rehydration.  Patient does not have any chest pain, shortness of breath or palpitations.  By description,  I have higher suspicion for orthostatic or vasovagal syncope with recent illness and poor oral intake.  Will continue to monitor and review EKG.  Influenza A positive urinalysis negative except for ketones and protein present.  Troponin less than 15.  GFR 60 glucose 159 potassium 3.0 lactic 1.5 white count 3.8 platelets 149 normal differential.  Patient has been rehydrated 2 L of lactated Ringer 's.  She reports feeling improved relative to this morning.  Still some nausea and achiness.  Will prescribe Zofran  and have discussed use of extra strength Tylenol for control of bodyaches fever and headache.  I have also prescribed potassium for some mild hypokalemia which appears to be secondary to poor oral intake.  Return precaution and follow-up plan reviewed.     Final diagnoses:  Influenza A  Syncope, unspecified syncope type  Dehydration  Hypokalemia    ED Discharge Orders          Ordered    ondansetron  (ZOFRAN -ODT) 4 MG disintegrating tablet  Every 4 hours PRN        01/07/25 1254    potassium chloride  SA (KLOR-CON  M) 20 MEQ tablet  2 times daily        01/07/25 1255               Armenta Canning, MD 01/07/25 1305  "

## 2025-01-07 NOTE — Discharge Instructions (Signed)
 1.  Take exercise Tylenol every 6 hours for fever and bodyaches.  You may take Zofran  every 4 hours as needed for nausea.  Try to stay well-hydrated with liquids containing sodium and potassium. 2.  Your potassium got a little low.  You have been prescribed some potassium tablets to take for the next couple days while you are rehydrating. 3.  Try to rest and be very careful when you stand up and change position.  If you feel little lightheaded immediately lie down and put your legs up.  Return to the emergency department if symptoms do not pass quickly or if you have any kind of pain associated with them. 4.  Make an appointment to see your doctor for recheck in the next 3 to 5 days.  Return to emergency department immediately if you have worsening or changing symptoms

## 2025-01-07 NOTE — ED Notes (Signed)
 Reviewed AVS/discharge instructions with patient. Time allotted for and all questions answered. Patient is agreeable for d/c and escorted to ED exit by staff.
